# Patient Record
Sex: Male | Born: 1990 | Race: Black or African American | Hispanic: No | Marital: Single | Smoking: Never smoker
Health system: Southern US, Community
[De-identification: ages and names within clinical notes are randomized; demographics above are authoritative.]

---

## 2014-04-01 ENCOUNTER — Encounter (HOSPITAL_COMMUNITY): Payer: Self-pay | Admitting: *Deleted

## 2014-04-01 ENCOUNTER — Emergency Department (HOSPITAL_COMMUNITY)
Admission: EM | Admit: 2014-04-01 | Discharge: 2014-04-01 | Disposition: A | Discharge status: Home / Self Care | Payer: Self-pay | Attending: Emergency Medicine | Admitting: Emergency Medicine

## 2014-04-01 DIAGNOSIS — B349 Viral infection, unspecified: Secondary | ICD-10-CM | POA: Insufficient documentation

## 2014-04-01 DIAGNOSIS — J069 Acute upper respiratory infection, unspecified: Secondary | ICD-10-CM | POA: Insufficient documentation

## 2014-04-01 MED ORDER — SALINE SPRAY 0.65 % NA SOLN: 1.0000 | NASAL | Status: DC | PRN

## 2014-04-01 NOTE — ED Notes (Signed)
Pt in c/o body aches, hot and cold flashes, possible fever at home, congestion, denies cough or other symptoms, no distress noted

## 2014-04-01 NOTE — ED Provider Notes (Signed)
CSN: [id number]     Arrival date & time 04/01/14  2026 History  This chart was scribed for non-physician practitioner, Celene Skeen, PA-C,working with Eber Hong, MD, by Karle Plumber, ED Scribe. This patient was seen in room TR06C/TR06C and the patient's care was started at 9:05 PM.  Chief Complaint  Patient presents with  . Generalized Body Aches  . Nasal Congestion   The history is provided by the patient and medical records. No language interpreter was used.    HPI Comments:  Cameron Richard is a 24 y.o. male who presents to the Emergency Department complaining of HA, congestion and subjective fever for the past two days. He reports an associated dry cough. He reports having sick contacts with his room mate. He has taken Excedrin with relief of his HA and allergy relief medication with no significant relief of any symptoms. Denies modifying factors. Denies nausea, vomiting, abdominal pain, sore throat, rash or otalgia.   History reviewed. No pertinent past medical history. History reviewed. No pertinent past surgical history. History reviewed. No pertinent family history. History  Substance Use Topics  . Smoking status: Never Smoker   . Smokeless tobacco: Not on file  . Alcohol Use: Not on file    Review of Systems  HENT: Positive for congestion. Negative for ear pain and sore throat.   Respiratory: Positive for cough.   Gastrointestinal: Negative for nausea, vomiting and abdominal pain.  Musculoskeletal: Positive for myalgias.  Neurological: Positive for headaches.  All other systems reviewed and are negative.   Allergies  Review of patient's allergies indicates no known allergies.  Home Medications   Prior to Admission medications   Medication Sig Start Date End Date Taking? Authorizing Provider  sodium chloride (OCEAN) 0.65 % SOLN nasal spray Place 1 spray into both nostrils as needed for congestion. 04/01/14   Kathrynn Speed, PA-C   Triage Vitals: BP 121/74 mmHg  Pulse  97  Temp(Src) 99.7 F (37.6 C) (Oral)  Resp 20  Wt 185 lb (83.915 kg)  SpO2 100% Physical Exam  Constitutional: He is oriented to person, place, and time. He appears well-developed and well-nourished. No distress.  HENT:  Head: Normocephalic and atraumatic.  Nasal congestion, mucosal edema, postnasal drip. No post oropharyngeal erythema or edema.  Eyes: Conjunctivae and EOM are normal.  Neck: Normal range of motion. Neck supple.  Cardiovascular: Normal rate, regular rhythm and normal heart sounds.   Pulmonary/Chest: Effort normal and breath sounds normal. No respiratory distress. He has no wheezes. He has no rales.  Musculoskeletal: Normal range of motion. He exhibits no edema.  Lymphadenopathy:    He has no cervical adenopathy.  Neurological: He is alert and oriented to person, place, and time.  Skin: Skin is warm and dry.  Psychiatric: He has a normal mood and affect. His behavior is normal.  Nursing note and vitals reviewed.   ED Course  Procedures (including critical care time) DIAGNOSTIC STUDIES: Oxygen Saturation is 100% on RA, normal by my interpretation.   COORDINATION OF CARE: 9:10 PM- Advised pt that his symptoms are likely viral and he should increase his fluid intake and rest. Will prescribe nasal spray. Pt verbalizes understanding and agrees to plan.  Medications - No data to display  Labs Review Labs Reviewed - No data to display  Imaging Review No results found.   EKG Interpretation None      MDM   Final diagnoses:  Viral illness  URI (upper respiratory infection)   Nontoxic appearing, NAD.  Temperature 99.7, vitals otherwise stable. Lungs clear. Symptomatic treatment. Stable for discharge. Return precautions given. Patient states understanding of treatment care plan and is agreeable.  I personally performed the services described in this documentation, which was scribed in my presence. The recorded information has been reviewed and is  accurate.    Kathrynn SpeedRobyn M Bunnie Rehberg, PA-C 04/01/14 2124  Eber HongBrian Miller, MD 04/02/14 [phone]

## 2014-04-01 NOTE — Discharge Instructions (Signed)
Use nasal spray as directed. Rest and stay well hydrated.  Cool Mist Vaporizers Vaporizers may help relieve the symptoms of a cough and cold. They add moisture to the air, which helps mucus to become thinner and less sticky. This makes it easier to breathe and cough up secretions. Cool mist vaporizers do not cause serious burns like hot mist vaporizers, which may also be called steamers or humidifiers. Vaporizers have not been proven to help with colds. You should not use a vaporizer if you are allergic to mold. HOME CARE INSTRUCTIONS  Follow the package instructions for the vaporizer.  Do not use anything other than distilled water in the vaporizer.  Do not run the vaporizer all of the time. This can cause mold or bacteria to grow in the vaporizer.  Clean the vaporizer after each time it is used.  Clean and dry the vaporizer well before storing it.  Stop using the vaporizer if worsening respiratory symptoms develop. Document Released: 09/26/2003 Document Revised: 01/03/2013 Document Reviewed: 05/18/2012 [facility] Patient Information 2015 [place], [place]. This information is not intended to replace advice given to you by your health care provider. Make sure you discuss any questions you have with your health care provider.  Viral Infections A viral infection can be caused by different types of viruses.Most viral infections are not serious and resolve on their own. However, some infections may cause severe symptoms and may lead to further complications. SYMPTOMS Viruses can frequently cause:  Minor sore throat.  Aches and pains.  Headaches.  Runny nose.  Different types of rashes.  Watery eyes.  Tiredness.  Cough.  Loss of appetite.  Gastrointestinal infections, resulting in nausea, vomiting, and diarrhea. These symptoms do not respond to antibiotics because the infection is not caused by bacteria. However, you might catch a bacterial infection following the viral  infection. This is sometimes called a "superinfection." Symptoms of such a bacterial infection may include:  Worsening sore throat with pus and difficulty swallowing.  Swollen neck glands.  Chills and a high or persistent fever.  Severe headache.  Tenderness over the sinuses.  Persistent overall ill feeling (malaise), muscle aches, and tiredness (fatigue).  Persistent cough.  Yellow, green, or brown mucus production with coughing. HOME CARE INSTRUCTIONS   Only take over-the-counter or prescription medicines for pain, discomfort, diarrhea, or fever as directed by your caregiver.  Drink enough water and fluids to keep your urine clear or pale yellow. Sports drinks can provide valuable electrolytes, sugars, and hydration.  Get plenty of rest and maintain proper nutrition. Soups and broths with crackers or rice are fine. SEEK IMMEDIATE MEDICAL CARE IF:   You have severe headaches, shortness of breath, chest pain, neck pain, or an unusual rash.  You have uncontrolled vomiting, diarrhea, or you are unable to keep down fluids.  You or your child has an oral temperature above 102 F (38.9 C), not controlled by medicine.  Your baby is older than 3 months with a rectal temperature of 102 F (38.9 C) or higher.  Your baby is 64 months old or younger with a rectal temperature of 100.4 F (38 C) or higher. MAKE SURE YOU:   Understand these instructions.  Will watch your condition.  Will get help right away if you are not doing well or get worse. Document Released: 10/08/2004 Document Revised: 03/23/2011 Document Reviewed: 05/05/2010 [facility] Patient Information 2015 [place], [place]. This information is not intended to replace advice given to you by your health care provider. Make sure you  discuss any questions you have with your health care provider.  Upper Respiratory Infection, Adult An upper respiratory infection (URI) is also sometimes known as the common cold. The upper  respiratory tract includes the nose, sinuses, throat, trachea, and bronchi. Bronchi are the airways leading to the lungs. Most people improve within 1 week, but symptoms can last up to 2 weeks. A residual cough may last even longer.  CAUSES Many different viruses can infect the tissues lining the upper respiratory tract. The tissues become irritated and inflamed and often become very moist. Mucus production is also common. A cold is contagious. You can easily spread the virus to others by oral contact. This includes kissing, sharing a glass, coughing, or sneezing. Touching your mouth or nose and then touching a surface, which is then touched by another person, can also spread the virus. SYMPTOMS  Symptoms typically develop 1 to 3 days after you come in contact with a cold virus. Symptoms vary from person to person. They may include:  Runny nose.  Sneezing.  Nasal congestion.  Sinus irritation.  Sore throat.  Loss of voice (laryngitis).  Cough.  Fatigue.  Muscle aches.  Loss of appetite.  Headache.  Low-grade fever. DIAGNOSIS  You might diagnose your own cold based on familiar symptoms, since most people get a cold 2 to 3 times a year. Your caregiver can confirm this based on your exam. Most importantly, your caregiver can check that your symptoms are not due to another disease such as strep throat, sinusitis, pneumonia, asthma, or epiglottitis. Blood tests, throat tests, and X-rays are not necessary to diagnose a common cold, but they may sometimes be helpful in excluding other more serious diseases. Your caregiver will decide if any further tests are required. RISKS AND COMPLICATIONS  You may be at risk for a more severe case of the common cold if you smoke cigarettes, have chronic heart disease (such as heart failure) or lung disease (such as asthma), or if you have a weakened immune system. The very young and very old are also at risk for more serious infections. Bacterial  sinusitis, middle ear infections, and bacterial pneumonia can complicate the common cold. The common cold can worsen asthma and chronic obstructive pulmonary disease (COPD). Sometimes, these complications can require emergency medical care and may be life-threatening. PREVENTION  The best way to protect against getting a cold is to practice good hygiene. Avoid oral or hand contact with people with cold symptoms. Wash your hands often if contact occurs. There is no clear evidence that vitamin C, vitamin E, echinacea, or exercise reduces the chance of developing a cold. However, it is always recommended to get plenty of rest and practice good nutrition. TREATMENT  Treatment is directed at relieving symptoms. There is no cure. Antibiotics are not effective, because the infection is caused by a virus, not by bacteria. Treatment may include:  Increased fluid intake. Sports drinks offer valuable electrolytes, sugars, and fluids.  Breathing heated mist or steam (vaporizer or shower).  Eating chicken soup or other clear broths, and maintaining good nutrition.  Getting plenty of rest.  Using gargles or lozenges for comfort.  Controlling fevers with ibuprofen or acetaminophen as directed by your caregiver.  Increasing usage of your inhaler if you have asthma. Zinc gel and zinc lozenges, taken in the first 24 hours of the common cold, can shorten the duration and lessen the severity of symptoms. Pain medicines may help with fever, muscle aches, and throat pain. A variety of  non-prescription medicines are available to treat congestion and runny nose. Your caregiver can make recommendations and may suggest nasal or lung inhalers for other symptoms.  HOME CARE INSTRUCTIONS   Only take over-the-counter or prescription medicines for pain, discomfort, or fever as directed by your caregiver.  Use a warm mist humidifier or inhale steam from a shower to increase air moisture. This may keep secretions moist and  make it easier to breathe.  Drink enough water and fluids to keep your urine clear or pale yellow.  Rest as needed.  Return to work when your temperature has returned to normal or as your caregiver advises. You may need to stay home longer to avoid infecting others. You can also use a face mask and careful hand washing to prevent spread of the virus. SEEK MEDICAL CARE IF:   After the first few days, you feel you are getting worse rather than better.  You need your caregiver's advice about medicines to control symptoms.  You develop chills, worsening shortness of breath, or brown or red sputum. These may be signs of pneumonia.  You develop yellow or brown nasal discharge or pain in the face, especially when you bend forward. These may be signs of sinusitis.  You develop a fever, swollen neck glands, pain with swallowing, or white areas in the back of your throat. These may be signs of strep throat. SEEK IMMEDIATE MEDICAL CARE IF:   You have a fever.  You develop severe or persistent headache, ear pain, sinus pain, or chest pain.  You develop wheezing, a prolonged cough, cough up blood, or have a change in your usual mucus (if you have chronic lung disease).  You develop sore muscles or a stiff neck. Document Released: 06/24/2000 Document Revised: 03/23/2011 Document Reviewed: 04/05/2013 [facility] Patient Information 2015 [place]. This information is not intended to replace advice given to you by your health care provider. Make sure you discuss any questions you have with your health care provider.

## 2015-10-08 ENCOUNTER — Encounter (HOSPITAL_COMMUNITY): Payer: Self-pay | Admitting: Family Medicine

## 2015-10-08 ENCOUNTER — Ambulatory Visit (HOSPITAL_COMMUNITY)
Admission: EM | Admit: 2015-10-08 | Discharge: 2015-10-08 | Disposition: A | Discharge status: Home / Self Care | Payer: Managed Care, Other (non HMO) | Attending: Family Medicine | Admitting: Family Medicine

## 2015-10-08 DIAGNOSIS — T7840XA Allergy, unspecified, initial encounter: Secondary | ICD-10-CM | POA: Diagnosis not present

## 2015-10-08 DIAGNOSIS — R21 Rash and other nonspecific skin eruption: Secondary | ICD-10-CM

## 2015-10-08 MED ORDER — TRIAMCINOLONE ACETONIDE 40 MG/ML IJ SUSP
INTRAMUSCULAR | Status: AC
  Filled 2015-10-08: qty 1

## 2015-10-08 MED ORDER — TRIAMCINOLONE ACETONIDE 40 MG/ML IJ SUSP
40.0000 mg | Freq: Once | INTRAMUSCULAR | Status: AC
  Administered 2015-10-08: 40 mg via INTRAMUSCULAR

## 2015-10-08 MED ORDER — PREDNISONE 20 MG PO TABS: ORAL_TABLET | ORAL | 0 refills | Status: DC

## 2015-10-08 NOTE — ED Triage Notes (Signed)
Pt here for rash to back and buttocks area.

## 2015-10-08 NOTE — Discharge Instructions (Signed)
Start taking one of the nondrowsy antihistamine such a Zyrtec, Claritin or Allegra daily for as long as you have the rash and itching.

## 2015-10-08 NOTE — ED Provider Notes (Signed)
CSN: [id number]     Arrival date & time 10/08/15  1003 History   First MD Initiated Contact with Patient 10/08/15 1116     Chief Complaint  Patient presents with  . Rash   (Consider location/radiation/quality/duration/timing/severity/associated sxs/prior Treatment) 25 year old male states last p.m. he developed a rash rather suddenly involving his back, buttocks and lesser to the upper extremities. It felt like that he was getting bit by bugs at the outset. Today she is complaining of itching all over in regards to the areas mentioned above. Few lesions and itching to the chest or lower extremities. Face is spared. Denies fever, chills.      History reviewed. No pertinent past medical history. History reviewed. No pertinent surgical history. History reviewed. No pertinent family history. Social History  Substance Use Topics  . Smoking status: Never Smoker  . Smokeless tobacco: Never Used  . Alcohol use Not on file    Review of Systems  Constitutional: Negative.   HENT: Negative.   Respiratory: Negative.   Gastrointestinal: Negative.   Genitourinary: Negative.   Skin: Positive for rash.  Neurological: Negative.   All other systems reviewed and are negative.   Allergies  Review of patient's allergies indicates no known allergies.  Home Medications   Prior to Admission medications   Medication Sig Start Date End Date Taking? Authorizing Provider  predniSONE (DELTASONE) 20 MG tablet Take 3 tabs po on first day, 2 tabs second day, 2 tabs third day, 1 tab fourth day, 1 tab 5th day. Take with food. 10/08/15   Hayden Rasmussenavid Lameka Disla, NP   Meds Ordered and Administered this Visit   Medications  triamcinolone acetonide (KENALOG-40) injection 40 mg (not administered)    BP 131/80   Pulse 74   Temp 97.9 F (36.6 C)   Resp 18   SpO2 95%  No data found.   Physical Exam  Constitutional: He is oriented to person, place, and time. He appears well-developed and well-nourished. No  distress.  HENT:  Head: Normocephalic and atraumatic.  Neck: Normal range of motion.  Cardiovascular: Normal rate and regular rhythm.   Pulmonary/Chest: Effort normal.  Neurological: He is alert and oriented to person, place, and time.  Skin: Skin is warm and dry. Rash noted.  Rashes primarily involving the entire back and buttocks. There are areas of raised wheals associated with scratch marks to the back and buttocks. In addition there appears to be a different type of rash, cutaneous/macular and a reticular pattern of darker brown and lighter brown. No pustules or vesicles.  Psychiatric: He has a normal mood and affect. His behavior is normal.  Nursing note and vitals reviewed.   Urgent Care Course   Clinical Course    Procedures (including critical care time)  Labs Review Labs Reviewed - No data to display  Imaging Review No results found.   Visual Acuity Review  Right Eye Distance:   Left Eye Distance:   Bilateral Distance:    Right Eye Near:   Left Eye Near:    Bilateral Near:         MDM   1. Rash and nonspecific skin eruption   2. Allergic reaction, initial encounter    Start taking one of the nondrowsy antihistamine such a Zyrtec, Claritin or Allegra daily for as long as you have the rash and itching. Meds ordered this encounter  Medications  . triamcinolone acetonide (KENALOG-40) injection 40 mg  . predniSONE (DELTASONE) 20 MG tablet    Sig: Take 3  tabs po on first day, 2 tabs second day, 2 tabs third day, 1 tab fourth day, 1 tab 5th day. Take with food.    Dispense:  9 tablet    Refill:  0    Order Specific Question:   Supervising Provider    Answer:   Linna Hoff [[id number]]         Hayden Rasmussen, NP 10/08/15 1135

## 2015-11-02 ENCOUNTER — Encounter (HOSPITAL_COMMUNITY): Payer: Self-pay | Admitting: *Deleted

## 2015-11-02 ENCOUNTER — Emergency Department (HOSPITAL_COMMUNITY)
Admission: EM | Admit: 2015-11-02 | Discharge: 2015-11-02 | Disposition: A | Discharge status: Home / Self Care | Payer: Managed Care, Other (non HMO) | Attending: Emergency Medicine | Admitting: Emergency Medicine

## 2015-11-02 DIAGNOSIS — R21 Rash and other nonspecific skin eruption: Secondary | ICD-10-CM

## 2015-11-02 NOTE — ED Notes (Signed)
Declined W/C at D/C and was escorted to lobby by RN. 

## 2015-11-02 NOTE — ED Triage Notes (Signed)
Pt checked in for cold symptoms but then said he wants to be seen more for rash to penile/groin area. Denies penile discharge. No distress noted at triage.

## 2015-11-02 NOTE — ED Provider Notes (Signed)
[facility]-EMERGENCY DEPT Provider Note   CSN: [id number] Arrival date & time: 11/02/15  1157   [id number] signing my name below, I, Cameron Richard, attest that this documentation has been prepared under the direction and in the presence of  BoeingChris Glenora Morocho, PA-C. Electronically Signed: Clovis PuAvnee Richard, ED Scribe. 11/02/15. 12:29 PM.   History   Chief Complaint Chief Complaint  Patient presents with  . Rash    The history is provided by the patient. No language interpreter was used.   HPI Comments:  Cameron Richard is a 25 y.o. male who presents to the Emergency Department complaining of moderate rash to his groin x 2-3 days. Pt notes associated itching. He denies any scrotum pain, penile discharge, and penile pain. No alleviating factors noted. Pt denies any other complaints at this time.   History reviewed. No pertinent past medical history.  There are no active problems to display for this patient.   History reviewed. No pertinent surgical history.     Home Medications    Prior to Admission medications   Medication Sig Start Date End Date Taking? Authorizing Provider  predniSONE (DELTASONE) 20 MG tablet Take 3 tabs po on first day, 2 tabs second day, 2 tabs third day, 1 tab fourth day, 1 tab 5th day. Take with food. 10/08/15   Hayden Rasmussenavid Mabe, NP    Family History History reviewed. No pertinent family history.  Social History Social History  Substance Use Topics  . Smoking status: Never Smoker  . Smokeless tobacco: Never Used  . Alcohol use Not on file     Allergies   Review of patient's allergies indicates no known allergies.   Review of Systems Review of Systems  Genitourinary: Negative for discharge and penile pain.  Skin: Positive for rash.     Physical Exam Updated Vital Signs BP 133/76 (BP Location: Right Arm)   Pulse 92   Temp 98.5 F (36.9 C) (Oral)   Resp 18   SpO2 97%   Physical Exam  HENT:  Head: Normocephalic and atraumatic.  Pulmonary/Chest: Effort normal.    Abdominal: Hernia confirmed negative in the right inguinal area and confirmed negative in the left inguinal area.  Genitourinary: Right testis shows no swelling and no tenderness. Left testis shows no swelling and no tenderness. No penile erythema or penile tenderness. No discharge found.     Lymphadenopathy: No inguinal adenopathy noted on the right or left side.  Skin: Skin is warm and dry.  Psychiatric: He has a normal mood and affect.  Nursing note and vitals reviewed.    ED Treatments / Results  DIAGNOSTIC STUDIES:  Oxygen Saturation is 97% on RA, normal by my interpretation.    COORDINATION OF CARE:  12:26 PM Discussed treatment plan with pt at bedside and pt agreed to plan.  Labs (all labs ordered are listed, but only abnormal results are displayed) Labs Reviewed - No data to display  EKG  EKG Interpretation None       Radiology No results found.  Procedures Procedures (including critical care time)  Medications Ordered in ED Medications - No data to display   Initial Impression / Assessment and Plan / ED Course  I have reviewed the triage vital signs and the nursing notes.  Pertinent labs & imaging results that were available during my care of the patient were reviewed by me and considered in my medical decision making (see chart for details).  Clinical Course   Patient will be checked for syphilis as he has  a painless lesion to the lateral aspect of the proximal right penis.  Advised patient the plan and these other areas appear to be more dry in nature, not open   Final Clinical Impressions(s) / ED Diagnoses   Final diagnoses:  None    New Prescriptions New Prescriptions   No medications on file  I personally performed the services described in this documentation, which was scribed in my presence. The recorded information has been reviewed and is accurate.    Charlestine Night, PA-C 11/02/15 1308    Jacalyn Lefevre, MD 11/02/15 [phone]

## 2015-11-02 NOTE — Discharge Instructions (Signed)
Keep the areas clean and dry. We will notify you of any abnormalities on the blood testing.

## 2015-11-03 LAB — RPR: RPR Ser Ql: NONREACTIVE

## 2016-08-02 ENCOUNTER — Encounter (HOSPITAL_COMMUNITY): Payer: Self-pay

## 2016-08-02 ENCOUNTER — Ambulatory Visit (HOSPITAL_COMMUNITY)
Admission: EM | Admit: 2016-08-02 | Discharge: 2016-08-02 | Disposition: A | Discharge status: Home / Self Care | Payer: BLUE CROSS/BLUE SHIELD | Attending: Physician Assistant | Admitting: Physician Assistant

## 2016-08-02 ENCOUNTER — Ambulatory Visit (INDEPENDENT_AMBULATORY_CARE_PROVIDER_SITE_OTHER): Payer: BLUE CROSS/BLUE SHIELD

## 2016-08-02 DIAGNOSIS — M79642 Pain in left hand: Secondary | ICD-10-CM

## 2016-08-02 DIAGNOSIS — S62317A Displaced fracture of base of fifth metacarpal bone. left hand, initial encounter for closed fracture: Secondary | ICD-10-CM

## 2016-08-02 DIAGNOSIS — W228XXA Striking against or struck by other objects, initial encounter: Secondary | ICD-10-CM

## 2016-08-02 DIAGNOSIS — S62327A Displaced fracture of shaft of fifth metacarpal bone, left hand, initial encounter for closed fracture: Secondary | ICD-10-CM

## 2016-08-02 MED ORDER — IBUPROFEN 800 MG PO TABS: 400.0000 mg | ORAL_TABLET | Freq: Three times a day (TID) | ORAL | 0 refills | Status: AC

## 2016-08-02 NOTE — Progress Notes (Signed)
Orthopedic Tech Progress Note Patient Details:  Cameron Richard 10/24/1990 [id number]  Ortho Devices Type of Ortho Device: Ace wrap, Ulna gutter splint Ortho Device/Splint Location: LUE Ortho Device/Splint Interventions: Ordered, Application   Jennye MoccasinHughes, Cameron Richard 08/02/2016, 8:58 PM

## 2016-08-02 NOTE — ED Notes (Signed)
Ulnar gutter  Splint  Applied  by  Ortho tech

## 2016-08-02 NOTE — Discharge Instructions (Addendum)
Do not remove the splint until instructed by orthopedics.

## 2016-08-02 NOTE — ED Triage Notes (Signed)
[facility] of [facility] x1 week, patient states he was playing around with friends and threw a fake punch and hit the door about 1 week ago

## 2016-08-02 NOTE — ED Provider Notes (Signed)
08/02/2016 8:02 PM   DOB: 11/22/1990 / MRN: [id number]  SUBJECTIVE:  Cameron Richard is a 26 y.o. male presenting for left hand pain.  He is left handed.  Punched a door a few days ago.  Has pain about the 4th and 5th metacarpal bone and associates bruising.  Tells me he can use the hand despite the pain.    He has No Known Allergies.   He  has no past medical history on file.    He  reports that he has never smoked. He has never used smokeless tobacco. He  has no sexual activity history on file. The patient  has no past surgical history on file.  His family history is not on file.  Review of Systems  Constitutional: Negative for fever.  Gastrointestinal: Negative for nausea.  Musculoskeletal: Positive for joint pain. Negative for falls.    OBJECTIVE:  BP 125/82 (BP Location: Right Arm)   Pulse 64   Temp 98.6 F (37 C) (Oral)   Resp 16   SpO2 100%   Physical Exam  Constitutional: He appears well-developed. He is active and cooperative.  Non-toxic appearance.  Cardiovascular: Normal rate.   Pulmonary/Chest: Effort normal. No tachypnea.  Musculoskeletal:       Left hand: He exhibits tenderness, bony tenderness and swelling. He exhibits normal range of motion, normal capillary refill, no deformity and no laceration.  Neurological: He is alert.  Skin: Skin is warm and dry. He is not diaphoretic. No pallor.  Vitals reviewed.   No results found for this or any previous visit (from the past 72 hour(s)).  Dg Hand Complete Left  Result Date: 08/02/2016 CLINICAL DATA:  Pain after trauma 1 week ago. EXAM: LEFT HAND - COMPLETE 3+ VIEW COMPARISON:  None. FINDINGS: There is a displaced comminuted fracture through the base of the fifth metacarpal. No definitive fracture through the base of the fourth metacarpal which is partially obscured by fracture fragments from the fifth metacarpal. No other abnormalities. IMPRESSION: Displaced comminuted fracture through the base of the fifth metacarpal.  Electronically Signed   By: Gerome Samavid  Williams III M.D   On: 08/02/2016 19:41    ASSESSMENT AND PLAN:  The primary encounter diagnosis was Left hand pain. A diagnosis of Closed displaced fracture of base of fifth metacarpal bone of left hand, initial encounter was also pertinent to this visit. Ulnar gutter splint and he will call ortho tomorrow to establish followup.    The patient is advised to call or return to clinic if he does not see an improvement in symptoms, or to seek the care of the closest emergency department if he worsens with the above plan.   Deliah BostonMichael Carlisle Torgeson, MHS, PA-C 08/02/2016 8:02 PM    Ofilia Neaslark, Danna Casella L, PA-C 08/02/16 2003

## 2016-08-10 ENCOUNTER — Other Ambulatory Visit: Payer: Self-pay | Admitting: Orthopedic Surgery

## 2016-08-10 ENCOUNTER — Other Ambulatory Visit: Payer: Managed Care, Other (non HMO)

## 2016-08-10 DIAGNOSIS — S62347A Nondisplaced fracture of base of fifth metacarpal bone. left hand, initial encounter for closed fracture: Secondary | ICD-10-CM

## 2016-08-12 ENCOUNTER — Ambulatory Visit
Admission: RE | Admit: 2016-08-12 | Discharge: 2016-08-12 | Disposition: A | Discharge status: Home / Self Care | Payer: Managed Care, Other (non HMO) | Source: Ambulatory Visit | Attending: Orthopedic Surgery | Admitting: Orthopedic Surgery

## 2016-08-12 DIAGNOSIS — S62347A Nondisplaced fracture of base of fifth metacarpal bone. left hand, initial encounter for closed fracture: Secondary | ICD-10-CM
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
## 2018-06-28 IMAGING — CT CT HAND*L* W/O CM
2 of 3 series · 15 of 33 positions shown, 18 images · non-contrast
Comparison: Radiographs dated 08/02/2016

CLINICAL DATA: Fracture of the fifth metacarpal.

EXAM:
CT OF THE LEFT HAND WITHOUT CONTRAST
TECHNIQUE: Multidetector CT imaging of the left hand was performed according to
the standard protocol. Multiplanar CT image reconstructions were
also generated.

[Series 2: ext soft · axial · 0.32mm/px · z∈[+603,+823]mm · 12 of 132 slices shown, 15 images]
[im 11/132  soft-tissue]
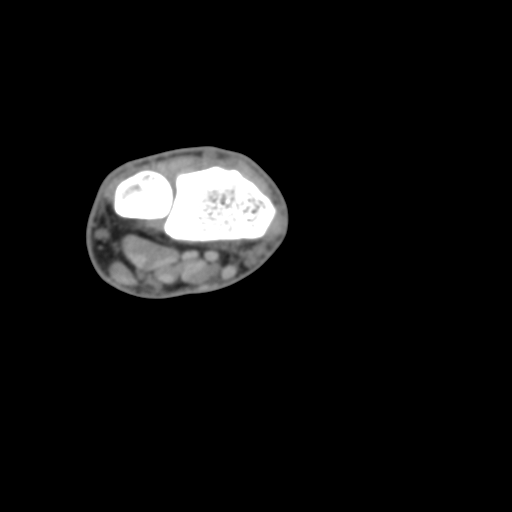
[im 11/132  bone]
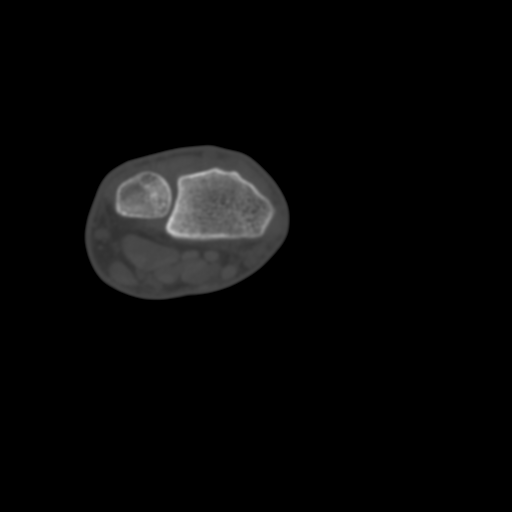
[im 21/132  bone]
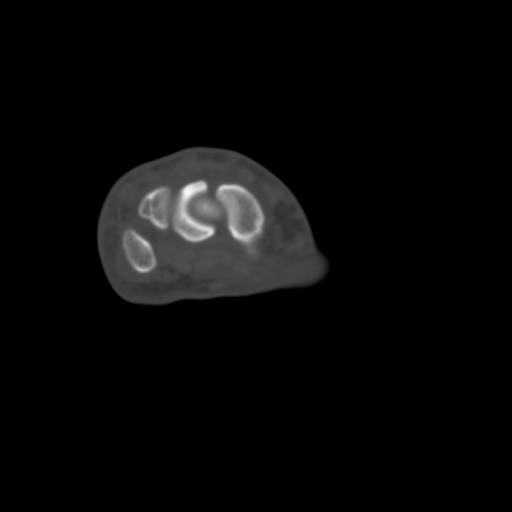
[im 31/132  bone]
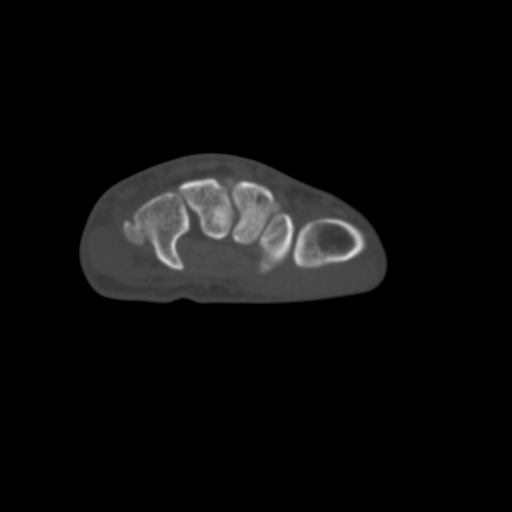
[im 41/132  bone]
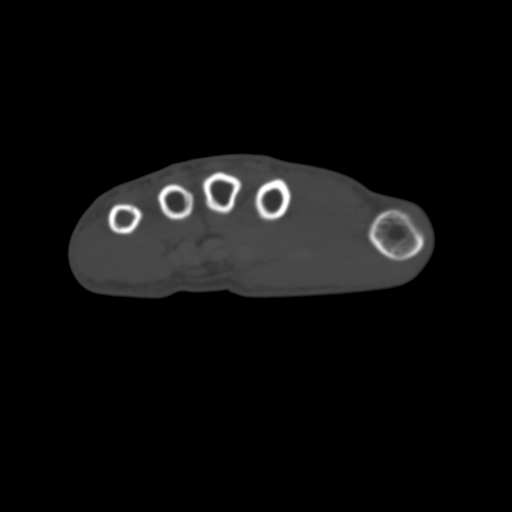
[im 51/132  soft-tissue]
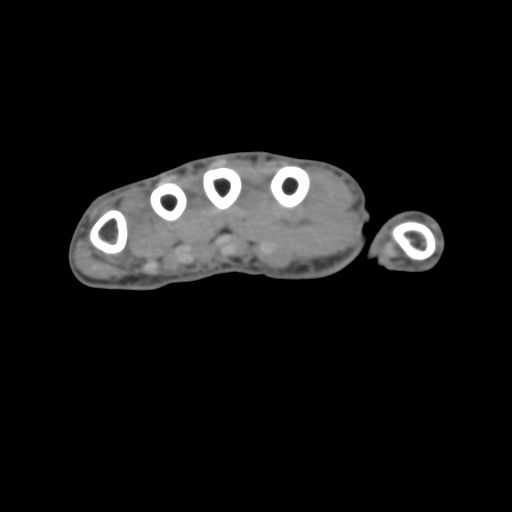
[im 51/132  bone]
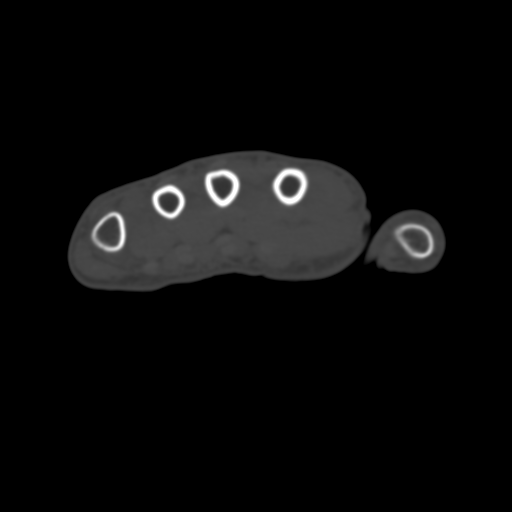
[im 61/132  bone]
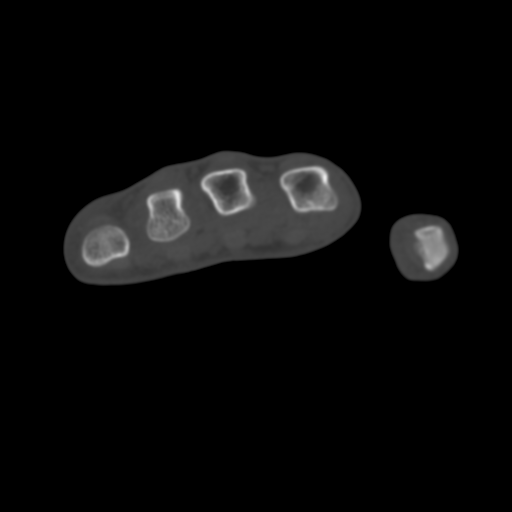
[im 71/132  bone]
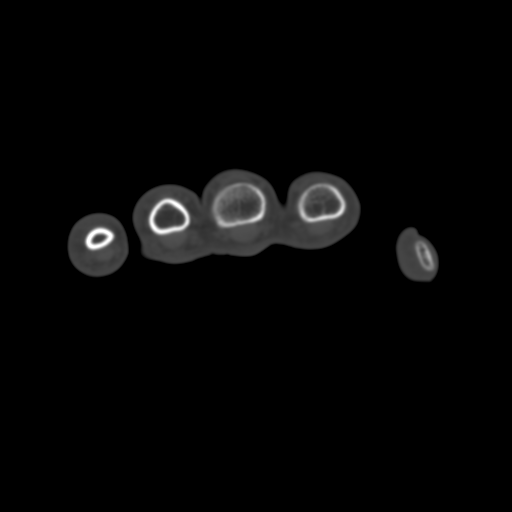
[im 81/132  bone]
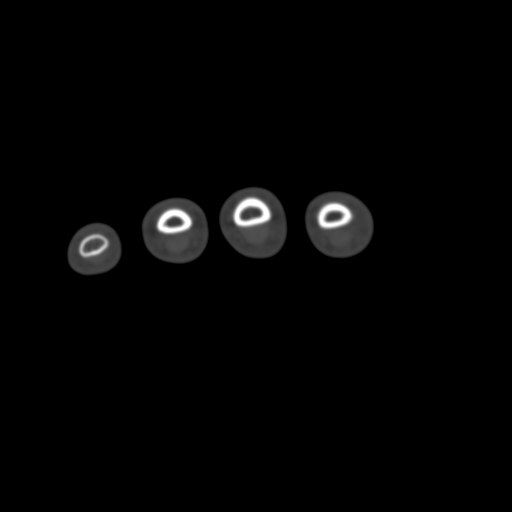
[im 91/132  soft-tissue]
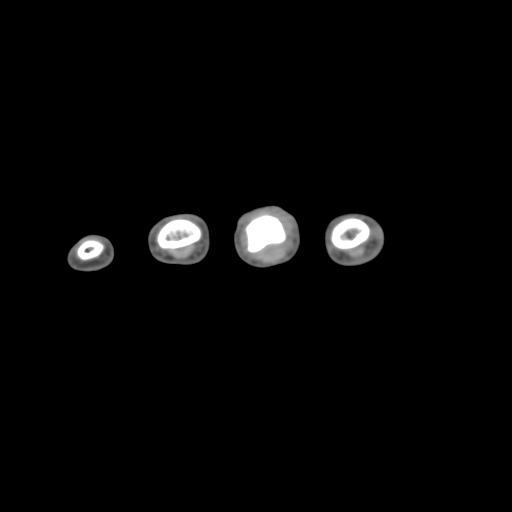
[im 91/132  bone]
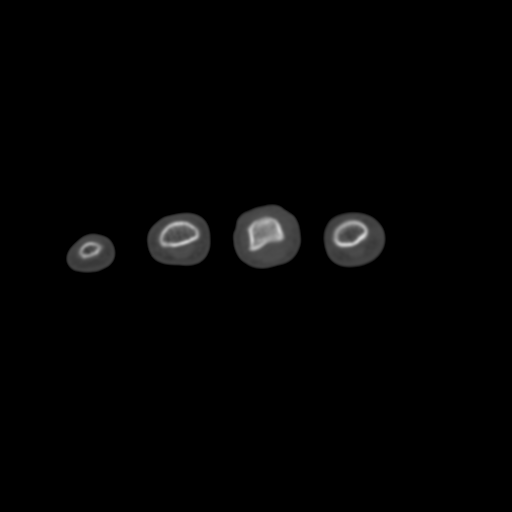
[im 101/132  bone]
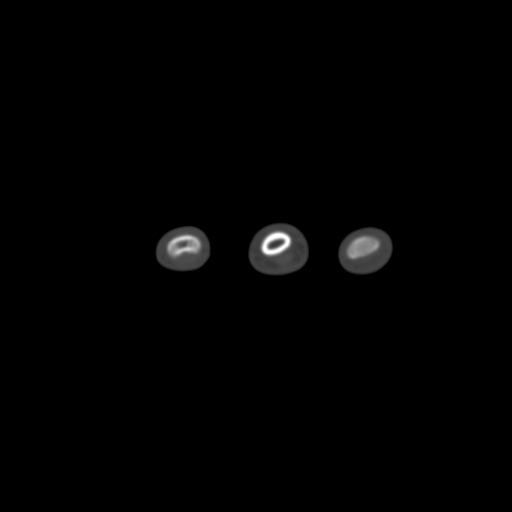
[im 111/132  bone]
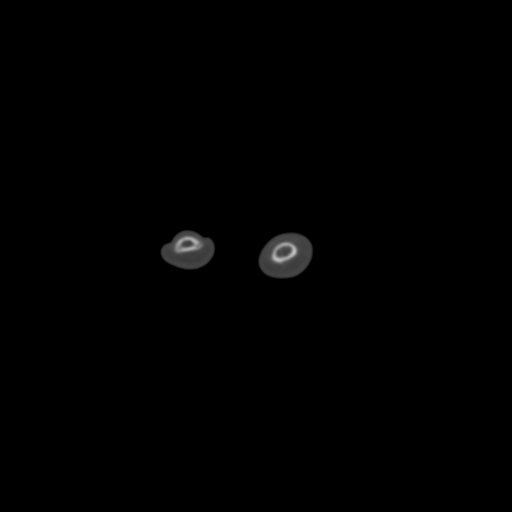
[im 121/132  bone]
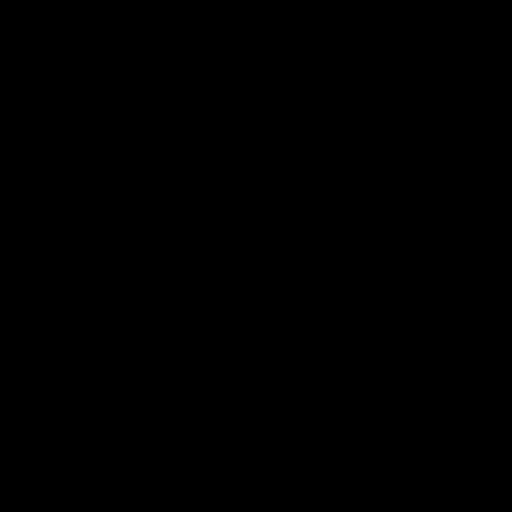

[Series 7: cor soft · coronal · 0.31mm/px · 3 of 29 slices shown]
[im 6/29  bone]
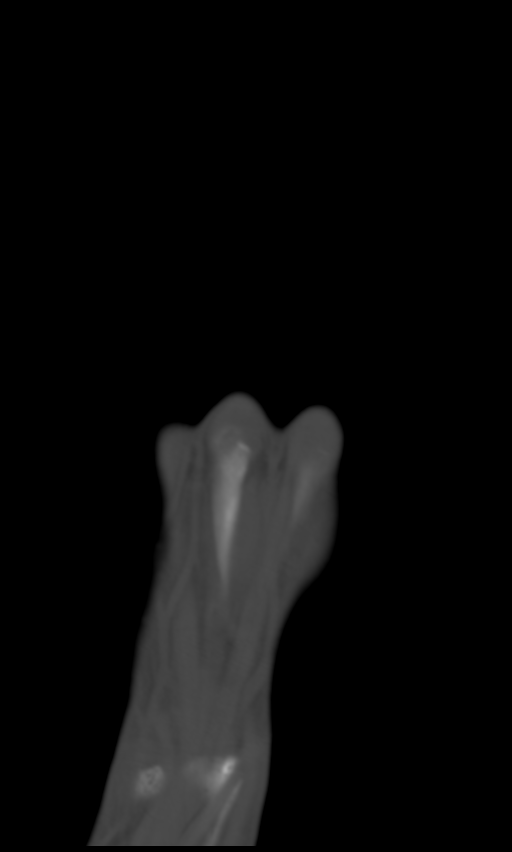
[im 12/29  bone]
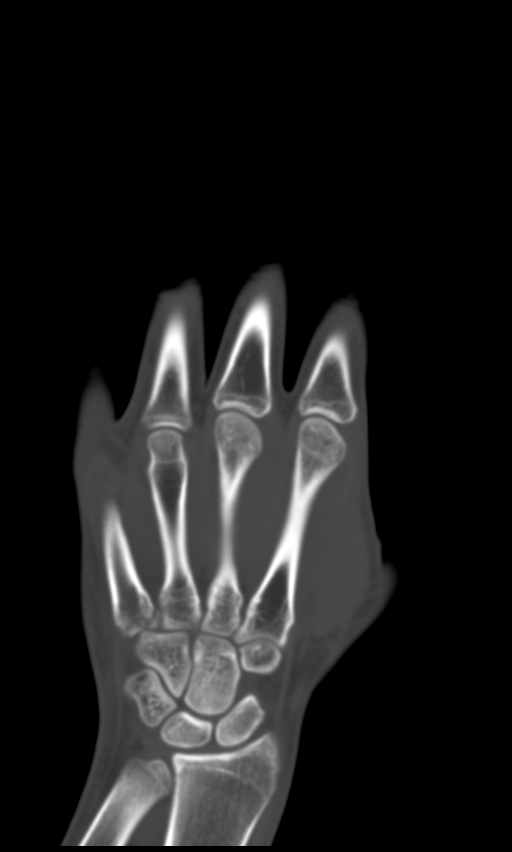
[im 17/29  bone]
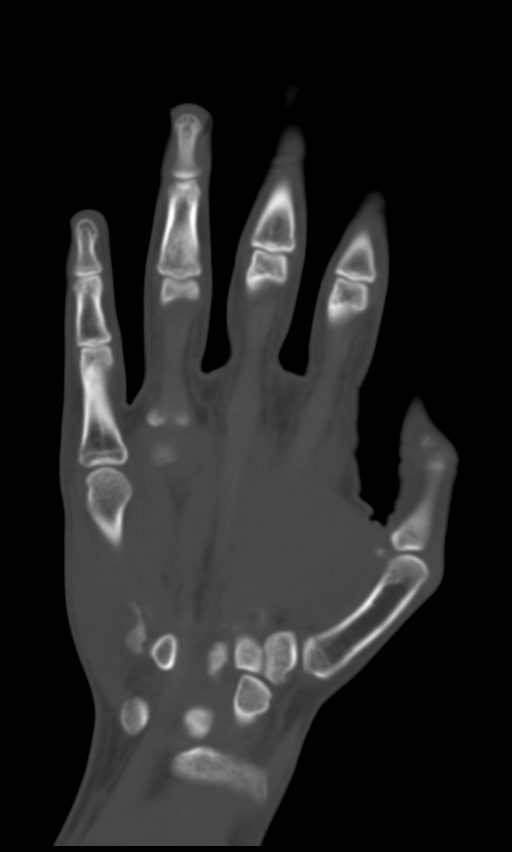

[15 of 33 positions shown; findings below may reference images not displayed]

FINDINGS: Bones/Joint/Cartilage

There is a markedly comminuted impacted fracture of the base of the
fifth metacarpal. The fracture involves the articular surface where
there is impaction and step-off and slight displacement. The
adjacent hamate is intact. The other carpal bones are normal. The
other metacarpals are intact. Phalanges are intact.
IMPRESSION: Comminuted fracture of the base of the fifth metacarpal. The
articular surface is severely comminuted.

## 2019-01-27 ENCOUNTER — Emergency Department: Payer: No Typology Code available for payment source

## 2019-01-27 ENCOUNTER — Other Ambulatory Visit: Payer: Self-pay

## 2019-01-27 ENCOUNTER — Emergency Department
Admission: EM | Admit: 2019-01-27 | Discharge: 2019-01-27 | Disposition: A | Discharge status: Home / Self Care | Payer: No Typology Code available for payment source | Attending: Emergency Medicine | Admitting: Emergency Medicine

## 2019-01-27 DIAGNOSIS — Y999 Unspecified external cause status: Secondary | ICD-10-CM | POA: Insufficient documentation

## 2019-01-27 DIAGNOSIS — M25552 Pain in left hip: Secondary | ICD-10-CM | POA: Diagnosis present

## 2019-01-27 DIAGNOSIS — W2211XA Striking against or struck by driver side automobile airbag, initial encounter: Secondary | ICD-10-CM | POA: Insufficient documentation

## 2019-01-27 DIAGNOSIS — S30811A Abrasion of abdominal wall, initial encounter: Secondary | ICD-10-CM | POA: Insufficient documentation

## 2019-01-27 DIAGNOSIS — Y9389 Activity, other specified: Secondary | ICD-10-CM | POA: Insufficient documentation

## 2019-01-27 DIAGNOSIS — Y92410 Unspecified street and highway as the place of occurrence of the external cause: Secondary | ICD-10-CM | POA: Insufficient documentation

## 2019-01-27 DIAGNOSIS — S60812A Abrasion of left wrist, initial encounter: Secondary | ICD-10-CM | POA: Diagnosis not present

## 2019-01-27 MED ORDER — METHOCARBAMOL 500 MG PO TABS: 500.0000 mg | ORAL_TABLET | Freq: Three times a day (TID) | ORAL | 0 refills | Status: AC | PRN

## 2019-01-27 MED ORDER — IBUPROFEN 800 MG PO TABS
800.0000 mg | ORAL_TABLET | Freq: Once | ORAL | Status: AC
  Administered 2019-01-27: 16:00:00 800 mg via ORAL
  Filled 2019-01-27: qty 1

## 2019-01-27 MED ORDER — MELOXICAM 15 MG PO TABS: 15.0000 mg | ORAL_TABLET | Freq: Every day | ORAL | 1 refills | Status: AC
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
## 2019-01-27 NOTE — ED Notes (Signed)
  Esign not working. Pt verbalized discharge instructions and has no questions at this time. 
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
## 2023-01-15 ENCOUNTER — Other Ambulatory Visit (HOSPITAL_BASED_OUTPATIENT_CLINIC_OR_DEPARTMENT_OTHER): Payer: Self-pay

## 2023-01-15 ENCOUNTER — Other Ambulatory Visit: Payer: Self-pay

## 2023-01-15 ENCOUNTER — Emergency Department (HOSPITAL_BASED_OUTPATIENT_CLINIC_OR_DEPARTMENT_OTHER)
Admission: EM | Admit: 2023-01-15 | Discharge: 2023-01-15 | Disposition: A | Discharge status: Home / Self Care | Payer: 59 | Attending: Emergency Medicine | Admitting: Emergency Medicine

## 2023-01-15 ENCOUNTER — Encounter (HOSPITAL_BASED_OUTPATIENT_CLINIC_OR_DEPARTMENT_OTHER): Payer: Self-pay

## 2023-01-15 DIAGNOSIS — R590 Localized enlarged lymph nodes: Secondary | ICD-10-CM | POA: Insufficient documentation

## 2023-01-15 DIAGNOSIS — M5412 Radiculopathy, cervical region: Secondary | ICD-10-CM

## 2023-01-15 DIAGNOSIS — M542 Cervicalgia: Secondary | ICD-10-CM | POA: Insufficient documentation

## 2023-01-15 MED ORDER — METHYLPREDNISOLONE 4 MG PO TBPK
ORAL_TABLET | ORAL | 0 refills | Status: AC
  Filled 2023-01-15: qty 21, 6d supply, fill #0

## 2023-01-15 MED ORDER — KETOROLAC TROMETHAMINE 30 MG/ML IJ SOLN
30.0000 mg | Freq: Once | INTRAMUSCULAR | Status: AC
  Administered 2023-01-15: 30 mg via INTRAMUSCULAR
  Filled 2023-01-15: qty 1

## 2023-01-15 MED ORDER — CYCLOBENZAPRINE HCL 10 MG PO TABS
10.0000 mg | ORAL_TABLET | Freq: Two times a day (BID) | ORAL | 0 refills | Status: AC | PRN
  Filled 2023-01-15: qty 20, 10d supply, fill #0

## 2023-01-15 MED ORDER — DEXAMETHASONE SODIUM PHOSPHATE 10 MG/ML IJ SOLN
10.0000 mg | Freq: Once | INTRAMUSCULAR | Status: AC
  Administered 2023-01-15: 10 mg via INTRAMUSCULAR
  Filled 2023-01-15: qty 1
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
## 2023-01-15 NOTE — ED Triage Notes (Signed)
 The patient woke up with neck soreness 2 weeks ago. He has tried ice, meds and a chiropractor with no relief.

## 2023-01-15 NOTE — Discharge Instructions (Addendum)
 You were seen in the emergency department today for neck pain.   I suspect this is likely caused by irritation/inflammation around the muscles and nerves.  Rest, gentle exercise and stretching will aid in recovery from any injuries to your neck.  Using medication such as Tylenol and ibuprofen  will help alleviate pain as well as decrease swelling and inflammation associated with these injuries. You may use 600 mg ibuprofen  every 6 hours or 1000 mg of Tylenol every 6 hours.  You may choose to alternate between the 2.  This would be most effective.  Not to exceed 4000 mg of Tylenol within 24 hours.  Not to exceed 3200 mg ibuprofen  in 24 hours.  I have prescribed you: steroids and a muscle relaxer. We gave you a dose of steroids in the ER today so wait to take your first dose of the prescribed steroids until tomorrow.   Salt water/Epson salt soaks, massage, icy hot/Biofreeze/BenGay and other similar products can help with symptoms.  Please return to the emergency department for reevaluation if you denies any new or concerning symptoms such as fever, new numbness or weakness in your legs, difficulty using the restroom or incontinence.   Please follow up with your primary care provider regarding your visit today. If you do not have a primary care provider, you may reach out to [facility] at [phone] to establish with one and make your first appointment.
